# Patient Record
Sex: Female | Born: 2000 | Race: White | Hispanic: No | Marital: Single | Smoking: Never smoker
Health system: Southern US, Community
[De-identification: ages and names within clinical notes are randomized; demographics above are authoritative.]

## PROBLEM LIST (undated history)

## (undated) DIAGNOSIS — R51 Headache: Secondary | ICD-10-CM

## (undated) DIAGNOSIS — R519 Headache, unspecified: Secondary | ICD-10-CM

## (undated) HISTORY — DX: Headache, unspecified: R51.9

## (undated) HISTORY — DX: Headache: R51

## (undated) HISTORY — PX: BIOPSY BOWEL: PRO7

---
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
## 2021-03-08 ENCOUNTER — Ambulatory Visit
Admission: EM | Admit: 2021-03-08 | Discharge: 2021-03-08 | Disposition: A | Discharge status: Home / Self Care | Payer: Federal, State, Local not specified - PPO | Attending: Physician Assistant | Admitting: Physician Assistant

## 2021-03-08 ENCOUNTER — Encounter: Payer: Self-pay | Admitting: Emergency Medicine

## 2021-03-08 ENCOUNTER — Other Ambulatory Visit: Payer: Self-pay

## 2021-03-08 DIAGNOSIS — N912 Amenorrhea, unspecified: Secondary | ICD-10-CM

## 2021-03-08 LAB — PREGNANCY, URINE: Preg Test, Ur: NEGATIVE

## 2021-03-08 MED ORDER — PRENATAL COMPLETE 14-0.4 MG PO TABS: 1.0000 | ORAL_TABLET | Freq: Every day | ORAL | 0 refills | Status: DC

## 2021-03-08 NOTE — ED Provider Notes (Signed)
[facility]    CSN: [id number] Arrival date & time: 03/08/21  1506      History   Chief Complaint Chief Complaint  Patient presents with  . Possible Pregnancy    HPI Jessica Weaver is a 20 y.o. female.   Jessica Weaver presents with complaints of late period as well as breast tenderness in context of positive home digital pregnancy test. States that per the app she uses to track her period she is approximately 7 days late, her LMP was 02/03/21. She is sexually active with her boyfriend, she isn't on birth control and doesn't use condoms. She had cramping last week and thought her period was going to start but it never has. She has had a few episodes of vomiting, without much nausea. No previous pregnancies. Endorses having family support and plans to discuss with her mother. Her sister sees an OB locally and she would feel comfortable following with them as well. She is not taking any regularly prescribed medications outside of amoxicillin she is taking for a sinus infection.    ROS per HPI, negative if not otherwise mentioned.      Past Medical History:  Diagnosis Date  . Headache     Patient Active Problem List   Diagnosis Date Noted  . Episodic tension type headache 06/02/2017  . Migraine 09/18/2015    Past Surgical History:  Procedure Laterality Date  . BIOPSY BOWEL     [facility]  . TONSILLECTOMY AND ADENOIDECTOMY  2015    OB History   No obstetric history on file.      Home Medications    Prior to Admission medications   Medication Sig Start Date End Date Taking? Authorizing Provider  Prenatal Vit-Fe Fumarate-FA (PRENATAL COMPLETE) 14-0.4 MG TABS Take 1 tablet by mouth daily. 03/08/21  Yes Jurni Cesaro, Dorene Grebe B, NP  cefdinir (OMNICEF) 300 MG capsule Take 1 capsule (300 mg total) by mouth 2 (two) times daily. 11/22/20   Fisher, Roselyn Bering, PA-C  Cyanocobalamin (VITAMIN B 12 PO) Take by mouth.    [provider]  Magnesium 200 MG TABS  Take by mouth.    [provider]  nortriptyline (PAMELOR) 10 MG capsule Take 10 mg by mouth at bedtime. 01/19/18 07/18/18  [provider]  promethazine (PHENERGAN) 12.5 MG tablet 1-2 tablets as needed for headache/nausea 12/19/16   Margurite Auerbach, MD  propranolol (INDERAL) 10 MG tablet Take 1/2 tablet at bedtime for 1 week, then 1 tablet at bedtime after that 06/02/17   Elveria Rising, NP  SUMAtriptan (IMITREX) 25 MG tablet Take 1 tablet (25 mg total) by mouth every 2 (two) hours as needed for migraine. May repeat in 2 hours if headache persists or recurs. 12/19/16   Margurite Auerbach, MD  Vitamin D, Ergocalciferol, (DRISDOL) 50000 units CAPS capsule Take 50,000 Units by mouth once a week. 01/25/18   [provider]    Family History Family History  Problem Relation Age of Onset  . Headache Father     Social History Social History   Tobacco Use  . Smoking status: Never Smoker  . Smokeless tobacco: Never Used  Substance Use Topics  . Alcohol use: Never    Alcohol/week: 0.0 standard drinks  . Drug use: Never     Allergies   Patient has no known allergies.   Review of Systems Review of Systems   Physical Exam Triage Vital Signs ED Triage Vitals  Enc Vitals Group     BP  03/08/21 1520 140/90     Pulse Rate 03/08/21 1520 100     Resp 03/08/21 1520 18     Temp 03/08/21 1520 98 F (36.7 C)     Temp Source 03/08/21 1520 Oral     SpO2 03/08/21 1520 100 %     Weight 03/08/21 1518 95 lb (43.1 kg)     Height 03/08/21 1518 4\' 11"  (1.499 m)     Head Circumference --      Peak Flow --      Pain Score 03/08/21 1518 0     Pain Loc --      Pain Edu? --      Excl. in GC? --    No data found.  Updated Vital Signs BP 140/90 (BP Location: Left Arm)   Pulse 100   Temp 98 F (36.7 C) (Oral)   Resp 18   Ht 4\' 11"  (1.499 m)   Wt 95 lb (43.1 kg)   LMP 02/03/2021   SpO2 100%   BMI 19.19 kg/m   Visual Acuity Right Eye Distance:   Left Eye  Distance:   Bilateral Distance:    Right Eye Near:   Left Eye Near:    Bilateral Near:     Physical Exam Constitutional:      General: She is not in acute distress.    Appearance: She is well-developed.  Cardiovascular:     Rate and Rhythm: Normal rate.  Pulmonary:     Effort: Pulmonary effort is normal.  Skin:    General: Skin is warm and dry.  Neurological:     Mental Status: She is alert and oriented to person, place, and time.      UC Treatments / Results  Labs (all labs ordered are listed, but only abnormal results are displayed) Labs Reviewed  PREGNANCY, URINE    EKG   Radiology No results found.  Procedures Procedures (including critical care time)  Medications Ordered in UC Medications - No data to display  Initial Impression / Assessment and Plan / UC Course  I have reviewed the triage vital signs and the nursing notes.  Pertinent labs & imaging results that were available during my care of the patient were reviewed by me and considered in my medical decision making (see chart for details).     Negative urine pregnancy test here in clinic today, however she does produce the positive digital pregnancy test which she took today. I feel the test here is likely a false negative, given this positive she has, late period, and symptoms consistent with early pregnancy. Advised to retest at home in a few more days to a week. If still positive and no bleeding to follow up with OB. Prenatal care discussed. Patient verbalized understanding and agreeable to plan.   Final Clinical Impressions(s) / UC Diagnoses   Final diagnoses:  Amenorrhea     Discharge Instructions     11/10/2021 would be estimated due date based on your last period.  You are approximately 4 w 5d pregnant based on your last period, according to your positive digital home test.  Don't smoke cigarettes.  Don't drink alcohol.  Tylenol as needed. Avoid ibuprofen/ motrin/ advil.  Retake a test  in three days if your period doesn't start. If positive call OB to set up your first appointment. This is typically done at around week 8-10.  Start a prenatal vitamin.  Normal regular activity is perfectly fine while pregnant.  Please seek care if you  have pain and/or bleeding.    ED Prescriptions    Medication Sig Dispense Auth. Provider   Prenatal Vit-Fe Fumarate-FA (PRENATAL COMPLETE) 14-0.4 MG TABS Take 1 tablet by mouth daily. 30 tablet Georgetta Haber, NP     PDMP not reviewed this encounter.   Georgetta Haber, NP 03/08/21 [phone]

## 2021-03-08 NOTE — Discharge Instructions (Addendum)
11/10/2021 would be estimated due date based on your last period.  You are approximately 4 w 5d pregnant based on your last period, according to your positive digital home test.  Don't smoke cigarettes.  Don't drink alcohol.  Tylenol as needed. Avoid ibuprofen/ motrin/ advil.  Retake a test in three days if your period doesn't start. If positive call OB to set up your first appointment. This is typically done at around week 8-10.  Start a prenatal vitamin.  Normal regular activity is perfectly fine while pregnant.  Please seek care if you have pain and/or bleeding.

## 2021-03-08 NOTE — ED Triage Notes (Signed)
Patient reports that she is 1 week late on her cycle. She took a home pregnancy test and this came back positive. She is wanting confirmation.

## 2021-03-12 ENCOUNTER — Ambulatory Visit
Admission: EM | Admit: 2021-03-12 | Discharge: 2021-03-12 | Disposition: A | Discharge status: Home / Self Care | Payer: Federal, State, Local not specified - PPO

## 2021-03-12 ENCOUNTER — Other Ambulatory Visit: Payer: Self-pay

## 2021-03-12 ENCOUNTER — Encounter: Payer: Self-pay | Admitting: Emergency Medicine

## 2021-03-12 DIAGNOSIS — B349 Viral infection, unspecified: Secondary | ICD-10-CM

## 2021-03-12 DIAGNOSIS — R0781 Pleurodynia: Secondary | ICD-10-CM

## 2021-03-12 DIAGNOSIS — R059 Cough, unspecified: Secondary | ICD-10-CM | POA: Diagnosis not present

## 2021-03-12 NOTE — ED Provider Notes (Signed)
[facility]    CSN: [id number] Arrival date & time: 03/12/21  1609      History   Chief Complaint Chief Complaint  Patient presents with  . Cough    HPI Jessica Weaver is a 20 y.o. female who is reportedly [redacted] weeks pregnant.  Patient presents today for approximately 3-week history of cough.  Patient says that over the past week she has had some left-sided lower rib pain and pain that seems to travel through to her back especially when she coughs.  She says her cough is nonproductive.  She denies any shortness of breath.  No fever, fatigue, body aches, nasal congestion, sore throat, abdominal pain, nausea/vomiting or diarrhea.  Not currently taking any OTC meds.  She did go to an urgent care on 03/01/2021 and had a negative PCR COVID test.  She has had a couple negative at home test as well.  She was prescribed amoxicillin at that time and took a 7-day course at that, finishing it about 4 days ago.  Patient has no other concerns.  HPI  Past Medical History:  Diagnosis Date  . Headache     Patient Active Problem List   Diagnosis Date Noted  . Episodic tension type headache 06/02/2017  . Migraine 09/18/2015    Past Surgical History:  Procedure Laterality Date  . BIOPSY BOWEL     [facility]  . TONSILLECTOMY AND ADENOIDECTOMY  2015    OB History    Gravida  1   Para      Term      Preterm      AB      Living        SAB      IAB      Ectopic      Multiple      Live Births               Home Medications    Prior to Admission medications   Medication Sig Start Date End Date Taking? Authorizing Provider  cefdinir (OMNICEF) 300 MG capsule Take 1 capsule (300 mg total) by mouth 2 (two) times daily. 11/22/20   Fisher, Roselyn Bering, PA-C  Cyanocobalamin (VITAMIN B 12 PO) Take by mouth.    [provider]  Magnesium 200 MG TABS Take by mouth.    [provider]  nortriptyline (PAMELOR) 10 MG capsule Take 10 mg by mouth at  bedtime. 01/19/18 07/18/18  [provider]  Prenatal Vit-Fe Fumarate-FA (PRENATAL COMPLETE) 14-0.4 MG TABS Take 1 tablet by mouth daily. 03/08/21   Georgetta Haber, NP  promethazine (PHENERGAN) 12.5 MG tablet 1-2 tablets as needed for headache/nausea 12/19/16   Margurite Auerbach, MD  propranolol (INDERAL) 10 MG tablet Take 1/2 tablet at bedtime for 1 week, then 1 tablet at bedtime after that 06/02/17   Elveria Rising, NP  SUMAtriptan (IMITREX) 25 MG tablet Take 1 tablet (25 mg total) by mouth every 2 (two) hours as needed for migraine. May repeat in 2 hours if headache persists or recurs. 12/19/16   Margurite Auerbach, MD  Vitamin D, Ergocalciferol, (DRISDOL) 50000 units CAPS capsule Take 50,000 Units by mouth once a week. 01/25/18   [provider]    Family History Family History  Problem Relation Age of Onset  . Headache Father     Social History Social History   Tobacco Use  . Smoking status: Never Smoker  . Smokeless tobacco: Never Used  Substance Use Topics  .  Alcohol use: Never    Alcohol/week: 0.0 standard drinks  . Drug use: Never     Allergies   Patient has no known allergies.   Review of Systems Review of Systems  Constitutional: Negative for chills, diaphoresis, fatigue and fever.  HENT: Negative for congestion, ear pain, rhinorrhea, sinus pressure, sinus pain and sore throat.   Respiratory: Positive for cough. Negative for shortness of breath.   Cardiovascular: Positive for chest pain (left rib pain).  Gastrointestinal: Negative for abdominal pain, nausea and vomiting.  Musculoskeletal: Negative for arthralgias and myalgias.  Skin: Negative for rash.  Neurological: Negative for weakness and headaches.  Hematological: Negative for adenopathy.     Physical Exam Triage Vital Signs ED Triage Vitals  Enc Vitals Group     BP 03/12/21 1632 133/81     Pulse Rate 03/12/21 1632 (!) 102     Resp 03/12/21 1632 17     Temp 03/12/21 1632 98.7 F (37.1  C)     Temp Source 03/12/21 1632 Oral     SpO2 03/12/21 1632 100 %     Weight 03/12/21 1630 97 lb (44 kg)     Height 03/12/21 1630 4\' 11"  (1.499 m)     Head Circumference --      Peak Flow --      Pain Score 03/12/21 1630 3     Pain Loc --      Pain Edu? --      Excl. in GC? --    No data found.  Updated Vital Signs BP 133/81 (BP Location: Left Arm)   Pulse (!) 102   Temp 98.7 F (37.1 C) (Oral)   Resp 17   Ht 4\' 11"  (1.499 m)   Wt 97 lb (44 kg)   LMP 02/03/2021   SpO2 100%   BMI 19.59 kg/m      Physical Exam Vitals and nursing note reviewed.  Constitutional:      General: She is not in acute distress.    Appearance: Normal appearance. She is not ill-appearing or toxic-appearing.  HENT:     Head: Normocephalic and atraumatic.     Nose: Nose normal.     Mouth/Throat:     Mouth: Mucous membranes are moist.     Pharynx: Oropharynx is clear.  Eyes:     General: No scleral icterus.       Right eye: No discharge.        Left eye: No discharge.     Conjunctiva/sclera: Conjunctivae normal.  Cardiovascular:     Rate and Rhythm: Normal rate and regular rhythm.     Heart sounds: Normal heart sounds.  Pulmonary:     Effort: Pulmonary effort is normal. No respiratory distress.     Breath sounds: Normal breath sounds. No wheezing, rhonchi or rales.  Musculoskeletal:     Cervical back: Neck supple.  Skin:    General: Skin is dry.  Neurological:     General: No focal deficit present.     Mental Status: She is alert. Mental status is at baseline.     Motor: No weakness.     Gait: Gait normal.  Psychiatric:        Mood and Affect: Mood normal.        Behavior: Behavior normal.        Thought Content: Thought content normal.      UC Treatments / Results  Labs (all labs ordered are listed, but only abnormal results are displayed) Labs Reviewed -  No data to display  EKG   Radiology No results found.  Procedures Procedures (including critical care  time)  Medications Ordered in UC Medications - No data to display  Initial Impression / Assessment and Plan / UC Course  I have reviewed the triage vital signs and the nursing notes.  Pertinent labs & imaging results that were available during my care of the patient were reviewed by me and considered in my medical decision making (see chart for details).   19 year old female who is reportedly [redacted] weeks pregnant presenting for cough x3 weeks.  All vital signs are normal and stable.  Her oxygen is 100%.  She is overall well-appearing.  We did discuss obtaining a chest x-ray and reviewed the risks versus benefits of that.  Advised her that based on her vitals and exam which is normal, I would hold off on an x-ray.  Advised her that we can shoulder from radiation but it can still travel.  Patient aware of the risk and would like to hold off on obtaining a chest x-ray at this time.  Advised her that the rib pain is likely secondary to coughing and I suggest suppressing the cough with Robitussin.  Advised her to stay hydrated.  We talked about taking Tylenol for the pain and also trying lidocaine or Salonpas patches.  Advised her to return for any fever, worsening cough, increased chest pain or shortness of breath.  Patient agreeable to plan.   Final Clinical Impressions(s) / UC Diagnoses   Final diagnoses:  Viral illness  Cough  Rib pain     Discharge Instructions     All of your vital signs are normal today.  Your exam is also normal.  Your lungs are clear when I listen to you.  I suspect that you have a viral illness versus allergies.  We did discuss obtaining a chest x-ray for your persistent symptoms and rib pain but after realizing the possible complications that could result from any radiation to the fetus, you have decided to go home and continue with supportive care at this time and follow-up for any worsening symptoms.  I have advised you to take Robitussin as needed for the cough and  increase your rest and fluids.  You can take Tylenol for pain relief.  You can take 1 g up to 3 times a day.  You can also apply Salonpas patch or lidocaine patch to the area that hurts.  You can also try heat or ice.  If you need help falling asleep you can take Benadryl or melatonin which are both safe.  Consider taking daily Claritin and using Flonase for possible allergies.  If you develop a fever, productive cough, increased rib pain or shortness of breath he should be seen again.  For any severe acute worsening of symptoms, please go to the emergency room.    ED Prescriptions    None     PDMP not reviewed this encounter.   Shirlee Latch, PA-C 03/12/21 1706

## 2021-03-12 NOTE — ED Triage Notes (Signed)
Patient c/o cough for over a week.  Patient states that she had a COVID test at a walk-in clinic last week and was negative.  Patient denies SOB.  Patient reports some discomfort in her chest when she take a deep breath.

## 2021-03-12 NOTE — Discharge Instructions (Addendum)
All of your vital signs are normal today.  Your exam is also normal.  Your lungs are clear when I listen to you.  I suspect that you have a viral illness versus allergies.  We did discuss obtaining a chest x-ray for your persistent symptoms and rib pain but after realizing the possible complications that could result from any radiation to the fetus, you have decided to go home and continue with supportive care at this time and follow-up for any worsening symptoms.  I have advised you to take Robitussin as needed for the cough and increase your rest and fluids.  You can take Tylenol for pain relief.  You can take 1 g up to 3 times a day.  You can also apply Salonpas patch or lidocaine patch to the area that hurts.  You can also try heat or ice.  If you need help falling asleep you can take Benadryl or melatonin which are both safe.  Consider taking daily Claritin and using Flonase for possible allergies.  If you develop a fever, productive cough, increased rib pain or shortness of breath he should be seen again.  For any severe acute worsening of symptoms, please go to the emergency room.

## 2021-06-30 ENCOUNTER — Other Ambulatory Visit: Payer: Self-pay

## 2021-06-30 ENCOUNTER — Ambulatory Visit
Admission: EM | Admit: 2021-06-30 | Discharge: 2021-06-30 | Disposition: A | Discharge status: Home / Self Care | Payer: Federal, State, Local not specified - PPO | Attending: Emergency Medicine | Admitting: Emergency Medicine

## 2021-06-30 DIAGNOSIS — M542 Cervicalgia: Secondary | ICD-10-CM

## 2021-06-30 DIAGNOSIS — S29019A Strain of muscle and tendon of unspecified wall of thorax, initial encounter: Secondary | ICD-10-CM

## 2021-06-30 MED ORDER — IBUPROFEN 600 MG PO TABS
600.0000 mg | ORAL_TABLET | Freq: Four times a day (QID) | ORAL | 0 refills | Status: DC | PRN
Start: 2021-06-30 — End: 2022-07-17

## 2021-06-30 MED ORDER — BACLOFEN 10 MG PO TABS: 10.0000 mg | ORAL_TABLET | Freq: Three times a day (TID) | ORAL | 0 refills | Status: DC

## 2021-06-30 NOTE — Discharge Instructions (Addendum)
Take the ibuprofen, 600 mg every 6 hours with food, on a schedule for the next 48 hours and then as needed.  Take the Baclofen, 10 mg every 8 hours, on a schedule for the next 48 hours and then as needed.  Apply moist heat to your back for 30 minutes at a time 2-3 times a day to improve blood flow to the area and help remove the lactic acid causing the spasm.  Follow the neck/back exercises given at discharge.  Follow-up with your PCP for lab work to check B vitamin levels, viral infection, and hormones.   Return for reevaluation for any new or worsening symptoms.

## 2021-06-30 NOTE — ED Provider Notes (Signed)
[facility]    CSN: [id number] Arrival date & time: 06/30/21  0813      History   Chief Complaint Chief Complaint  Patient presents with   Back Pain    HPI Jessica Weaver is a 20 y.o. female.   HPI  20 year old female here for evaluation of upper back pain and facial numbness.  Patient reports that she has been experiencing pain in her upper back and neck for the past 2 days.  Her facial numbness has been occurring at random times for the past 2 weeks.  She does not remember any increase stress, feelings of anxiety, or heavy breathing at the time.  She reports that her entire face and her ears will go numb and then will resolve.  She does have a distant history of migraines and also B12 deficiency but she has not had a migraine in a while and she has not been on her B12 for a while either.  She recently turned 19 and she is transitioning between the pediatric and adult side of her primary care but has not been established with an adult primary care yet.  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  She denies any accidents or falls.  She has not had any headaches, change in her vision, or difficulty performing her ADLs.  She also has not added any new medications or supplements.  She did recently stop her birth control patch.  Past Medical History:  Diagnosis Date   Headache     Patient Active Problem List   Diagnosis Date Noted   Episodic tension type headache 06/02/2017   Migraine 09/18/2015    Past Surgical History:  Procedure Laterality Date   BIOPSY BOWEL     [facility]   TONSILLECTOMY AND ADENOIDECTOMY  2015    OB History     Gravida  1   Para      Term      Preterm      AB      Living         SAB      IAB      Ectopic      Multiple      Live Births               Home Medications    Prior to Admission medications   Medication Sig Start Date End  Date Taking? Authorizing Provider  baclofen (LIORESAL) 10 MG tablet Take 1 tablet (10 mg total) by mouth 3 (three) times daily. 06/30/21  Yes Becky Augusta, NP  ibuprofen (ADVIL) 600 MG tablet Take 1 tablet (600 mg total) by mouth every 6 (six) hours as needed. 06/30/21  Yes Becky Augusta, NP  nortriptyline (PAMELOR) 10 MG capsule Take 10 mg by mouth at bedtime. 01/19/18 07/18/18  [provider]    Family History Family History  Problem Relation Age of Onset   Headache Father     Social History Social History   Tobacco Use   Smoking status: Never   Smokeless tobacco: Never  Substance Use Topics   Alcohol use: Never    Alcohol/week: 0.0 standard drinks   Drug use: Never     Allergies   Patient has no known allergies.   Review of Systems Review of Systems  Constitutional:  Negative for activity change, appetite change and fever.  Eyes:  Negative for visual disturbance.  Musculoskeletal:  Positive for back pain and neck pain.  Neurological:  Positive for numbness. Negative for weakness and headaches.  Hematological: Negative.   Psychiatric/Behavioral: Negative.      Physical Exam Triage Vital Signs ED Triage Vitals  Enc Vitals Group     BP 06/30/21 0831 116/74     Pulse Rate 06/30/21 0831 77     Resp 06/30/21 0831 18     Temp 06/30/21 0831 98 F (36.7 C)     Temp Source 06/30/21 0831 Oral     SpO2 06/30/21 0831 100 %     Weight 06/30/21 0830 94 lb (42.6 kg)     Height 06/30/21 0830 4\' 11"  (1.499 m)     Head Circumference --      Peak Flow --      Pain Score 06/30/21 0829 8     Pain Loc --      Pain Edu? --      Excl. in GC? --    No data found.  Updated Vital Signs BP 116/74 (BP Location: Left Arm)   Pulse 77   Temp 98 F (36.7 C) (Oral)   Resp 18   Ht 4\' 11"  (1.499 m)   Wt 94 lb (42.6 kg)   LMP 06/16/2021   SpO2 100%   Breastfeeding No   BMI 18.99 kg/m   Visual Acuity Right Eye Distance:   Left Eye Distance:   Bilateral Distance:     Right Eye Near:   Left Eye Near:    Bilateral Near:     Physical Exam Vitals and nursing note reviewed.  Constitutional:      General: She is not in acute distress.    Appearance: Normal appearance. She is normal weight. She is not ill-appearing.  HENT:     Head: Normocephalic and atraumatic.     Right Ear: Tympanic membrane, ear canal and external ear normal. There is no impacted cerumen.     Left Ear: Tympanic membrane, ear canal and external ear normal. There is no impacted cerumen.     Mouth/Throat:     Mouth: Mucous membranes are moist.     Pharynx: Oropharynx is clear. No posterior oropharyngeal erythema.  Eyes:     General: No scleral icterus.    Extraocular Movements: Extraocular movements intact.     Conjunctiva/sclera: Conjunctivae normal.     Pupils: Pupils are equal, round, and reactive to light.  Cardiovascular:     Rate and Rhythm: Normal rate and regular rhythm.     Pulses: Normal pulses.     Heart sounds: Normal heart sounds. No murmur heard.   No gallop.  Pulmonary:     Effort: Pulmonary effort is normal.     Breath sounds: Normal breath sounds. No wheezing, rhonchi or rales.  Musculoskeletal:        General: No swelling or tenderness. Normal range of motion.     Cervical back: Normal range of motion and neck supple. No rigidity.  Lymphadenopathy:     Cervical: No cervical adenopathy.  Skin:    General: Skin is warm and dry.     Capillary Refill: Capillary refill takes less than 2 seconds.     Findings: No erythema or rash.  Neurological:     General: No focal deficit present.     Mental Status: She is alert and oriented to person, place, and time.     Cranial Nerves: No cranial nerve deficit.     Sensory: No sensory deficit.  Motor: No weakness.     Coordination: Coordination normal.     Deep Tendon Reflexes: Reflexes normal.  Psychiatric:        Mood and Affect: Mood normal.        Behavior: Behavior normal.        Thought Content: Thought  content normal.        Judgment: Judgment normal.     UC Treatments / Results  Labs (all labs ordered are listed, but only abnormal results are displayed) Labs Reviewed - No data to display  EKG   Radiology No results found.  Procedures Procedures (including critical care time)  Medications Ordered in UC Medications - No data to display  Initial Impression / Assessment and Plan / UC Course  I have reviewed the triage vital signs and the nursing notes.  Pertinent labs & imaging results that were available during my care of the patient were reviewed by me and considered in my medical decision making (see chart for details).  Patient is a very pleasant, nontoxic-appearing 20 year old female here for evaluation of upper back and neck pain x2 days and intermittent facial numbness for 2 weeks.  She is not experiencing facial numbness at this time.  She also indicates that sometimes she will feel numbness through her whole body but none of that is present currently.  Physical exam reveals pearly gray tympanic membranes bilaterally with normal light reflex and clear external auditory canals.  Pupils are equal round and reactive, EOM is intact, and cranial nerves II through XII are intact.  Oropharyngeal exam is benign.  No cervical lymphadenopathy appreciated exam.  Cardiopulmonary exam reveals clear lung sounds in all fields.  Patient does have bilateral lower cervical paraspinous tenderness and muscle tension as well as upper thoracic muscle tension in bilateral paraspinous regions.  No midline spinal tenderness appreciated exam.  Her tenderness is greater on the right than the left.  Bilateral grips and upper extremity strength are 5/5 and lower extremity strength is 5/5.  DTRs are 2+ globally.  Patient's neurological exam is benign and the etiology of her intermittent facial numbness and body numbness is unclear.  This could be be vitamin deficiency, hormonal related, or idiopathic.  The neck  and upper back pain are secondary to muscle tension and mild spasm.  We will treat with 600 mg ibuprofen tablets and 10 mg baclofen tablets to help with inflammation and spasm.  Patient also given back and neck exercises to do at home.  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  Patient encouraged to follow-up with her primary care doctor for further testing to determine the cause of her intermittent numbness.   Final Clinical Impressions(s) / UC Diagnoses   Final diagnoses:  Cervicalgia  Thoracic myofascial strain, initial encounter     Discharge Instructions      Take the ibuprofen, 600 mg every 6 hours with food, on a schedule for the next 48 hours and then as needed.  Take the Baclofen, 10 mg every 8 hours, on a schedule for the next 48 hours and then as needed.  Apply moist heat to your back for 30 minutes at a time 2-3 times a day to improve blood flow to the area and help remove  the lactic acid causing the spasm.  Follow the neck/back exercises given at discharge.  Follow-up with your PCP for lab work to check B vitamin levels, viral infection, and hormones.   Return for reevaluation for any new or worsening symptoms.      ED Prescriptions     Medication Sig Dispense Auth. Provider   baclofen (LIORESAL) 10 MG tablet Take 1 tablet (10 mg total) by mouth 3 (three) times daily. 30 each Becky Augusta, NP   ibuprofen (ADVIL) 600 MG tablet Take 1 tablet (600 mg total) by mouth every 6 (six) hours as needed. 30 tablet Becky Augusta, NP      PDMP not reviewed this encounter.   Becky Augusta, NP 06/30/21 [phone]

## 2021-06-30 NOTE — ED Triage Notes (Signed)
Patient states that she has been having upper back pain "around my spine". States that she has noticed at times her face and arms will go numb. States that this started 2 weeks ago and worsened on Monday. Denies any known injury.

## 2021-08-09 ENCOUNTER — Ambulatory Visit
Admission: EM | Admit: 2021-08-09 | Discharge: 2021-08-09 | Disposition: A | Discharge status: Home / Self Care | Payer: Federal, State, Local not specified - PPO

## 2021-08-09 ENCOUNTER — Encounter: Payer: Self-pay | Admitting: Emergency Medicine

## 2021-08-09 ENCOUNTER — Other Ambulatory Visit: Payer: Self-pay

## 2021-08-09 DIAGNOSIS — R051 Acute cough: Secondary | ICD-10-CM

## 2021-08-09 DIAGNOSIS — R0981 Nasal congestion: Secondary | ICD-10-CM | POA: Diagnosis not present

## 2021-08-09 DIAGNOSIS — J069 Acute upper respiratory infection, unspecified: Secondary | ICD-10-CM

## 2021-08-09 NOTE — ED Provider Notes (Signed)
[facility]    CSN: [id number] Arrival date & time: 08/09/21  1020      History   Chief Complaint Chief Complaint  Patient presents with   Cough   Nasal Congestion    HPI Jessica Weaver is a 20 y.o. female presenting for approximately 1 week history of nasal congestion, sinus pressure and cough.  Nasal congestion is significant and rhinorrhea is yellowish.  Patient also mitts to headache and pain in her wisdom teeth.  No chest pain or breathing difficulty.  No vomiting or diarrhea.  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  States multiple children have been out with RSV.  She has had a negative at home COVID test and does not desire to be retested.  Patient has been taking DayQuil and NyQuil without improvement in symptoms.  She has not had any fevers and no worsening of symptoms but has not felt better.  No other complaints or concerns.  HPI  Past Medical History:  Diagnosis Date   Headache     Patient Active Problem List   Diagnosis Date Noted   Episodic tension type headache 06/02/2017   Migraine 09/18/2015    Past Surgical History:  Procedure Laterality Date   BIOPSY BOWEL     [facility]   TONSILLECTOMY AND ADENOIDECTOMY  2015    OB History     Gravida  1   Para      Term      Preterm      AB      Living         SAB      IAB      Ectopic      Multiple      Live Births               Home Medications    Prior to Admission medications   Medication Sig Start Date End Date Taking? Authorizing Provider  baclofen (LIORESAL) 10 MG tablet Take 1 tablet (10 mg total) by mouth 3 (three) times daily. 06/30/21   Becky Augusta, NP  ibuprofen (ADVIL) 600 MG tablet Take 1 tablet (600 mg total) by mouth every 6 (six) hours as needed. 06/30/21   Becky Augusta, NP  nortriptyline (PAMELOR) 10 MG capsule Take 10 mg by mouth at bedtime. 01/19/18 07/18/18  [provider]    Family History Family History  Problem Relation Age of Onset    Headache Father     Social History Social History   Tobacco Use   Smoking status: Never   Smokeless tobacco: Never  Vaping Use   Vaping Use: Never used  Substance Use Topics   Alcohol use: Never    Alcohol/week: 0.0 standard drinks   Drug use: Never     Allergies   Patient has no known allergies.   Review of Systems Review of Systems  Constitutional:  Positive for fatigue. Negative for chills, diaphoresis and fever.  HENT:  Positive for congestion, rhinorrhea and sinus pressure. Negative for ear pain and sore throat.   Respiratory:  Positive for cough. Negative for shortness of breath.   Gastrointestinal:  Negative for abdominal pain, nausea and vomiting.  Musculoskeletal:  Negative for arthralgias and myalgias.  Skin:  Negative for rash.  Neurological:  Positive for headaches. Negative for weakness.  Hematological:  Negative for adenopathy.    Physical Exam Triage Vital Signs ED Triage Vitals  Enc Vitals Group     BP 08/09/21 1104 118/80  Pulse Rate 08/09/21 1104 (!) 110     Resp 08/09/21 1104 18     Temp 08/09/21 1104 98.3 F (36.8 C)     Temp Source 08/09/21 1104 Oral     SpO2 08/09/21 1104 99 %     Weight 08/09/21 1102 96 lb 4.8 oz (43.7 kg)     Height --      Head Circumference --      Peak Flow --      Pain Score 08/09/21 1101 2     Pain Loc --      Pain Edu? --      Excl. in GC? --    No data found.  Updated Vital Signs BP 118/80 (BP Location: Left Arm)   Pulse (!) 110   Temp 98.3 F (36.8 C) (Oral)   Resp 18   Wt 96 lb 4.8 oz (43.7 kg)   LMP 07/17/2021   SpO2 99%   BMI 19.45 kg/m      Physical Exam Vitals and nursing note reviewed.  Constitutional:      General: She is not in acute distress.    Appearance: Normal appearance. She is ill-appearing. She is not toxic-appearing.  HENT:     Head: Normocephalic and atraumatic.     Right Ear: Tympanic membrane, ear canal and external ear normal.     Left Ear: Tympanic membrane, ear  canal and external ear normal.     Nose: Congestion present.     Mouth/Throat:     Mouth: Mucous membranes are moist.     Pharynx: Oropharynx is clear.  Eyes:     General: No scleral icterus.       Right eye: No discharge.        Left eye: No discharge.     Conjunctiva/sclera: Conjunctivae normal.  Cardiovascular:     Rate and Rhythm: Regular rhythm. Tachycardia present.     Heart sounds: Normal heart sounds.  Pulmonary:     Effort: Pulmonary effort is normal. No respiratory distress.     Breath sounds: Normal breath sounds.  Musculoskeletal:     Cervical back: Neck supple.  Skin:    General: Skin is dry.  Neurological:     General: No focal deficit present.     Mental Status: She is alert. Mental status is at baseline.     Motor: No weakness.     Gait: Gait normal.  Psychiatric:        Mood and Affect: Mood normal.        Behavior: Behavior normal.        Thought Content: Thought content normal.     UC Treatments / Results  Labs (all labs ordered are listed, but only abnormal results are displayed) Labs Reviewed - No data to display  EKG   Radiology No results found.  Procedures Procedures (including critical care time)  Medications Ordered in UC Medications - No data to display  Initial Impression / Assessment and Plan / UC Course  I have reviewed the triage vital signs and the nursing notes.  Pertinent labs & imaging results that were available during my care of the patient were reviewed by me and considered in my medical decision making (see chart for details).  20 year old female presenting for 1 week history of nasal congestion, sinus pressure and cough.  Vitals are normal and stable.  She is ill-appearing but nontoxic.  Exam significant for moderate nasal congestion.  Chest is clear to auscultation heart regular rate  and rhythm.  Clinical presentation consistent with viral URI.  Advised switching to Sudafed at this time and starting Flonase and nasal  saline.  Advised increasing rest and fluids.  Reassured her that she should be feeling better in the next few days.  Thoroughly reviewed return and ED precautions with patient.  Work note has been given for the next couple days.  Final Clinical Impressions(s) / UC Diagnoses   Final diagnoses:  Viral upper respiratory tract infection  Nasal congestion  Acute cough     Discharge Instructions      -Your symptoms are consistent with a viral upper respiratory infection.  If you do not think DayQuil and NyQuil are working switch to [employer].  I will also suggest that you use Flonase since her nose is swollen.  Increase rest and fluids and continue with the vaporizer. -You may take another few days for you to feel better but you should not really be feeling any worse.  If you have a fever or start to have any breathing issues you should be seen again.     ED Prescriptions   None    PDMP not reviewed this encounter.   Shirlee Latch, PA-C 08/09/21 1153

## 2021-08-09 NOTE — ED Triage Notes (Signed)
Pt c/o nasal congestion, cough. Started about a week ago. She states she started getting worse about 2 days ago. Denies fever but has felt like she had one. She states her head and back upper teeth have been hurting also. 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

## 2021-08-09 NOTE — Discharge Instructions (Addendum)
-  Your symptoms are consistent with a viral upper respiratory infection.  If you do not think DayQuil and NyQuil are working switch to [employer].  I will also suggest that you use Flonase since her nose is swollen.  Increase rest and fluids and continue with the vaporizer. -You may take another few days for you to feel better but you should not really be feeling any worse.  If you have a fever or start to have any breathing issues you should be seen again.

## 2021-08-14 ENCOUNTER — Other Ambulatory Visit: Payer: Self-pay

## 2021-08-14 ENCOUNTER — Emergency Department: Payer: Federal, State, Local not specified - PPO

## 2021-08-14 ENCOUNTER — Encounter: Payer: Self-pay | Admitting: Intensive Care

## 2021-08-14 ENCOUNTER — Emergency Department
Admission: EM | Admit: 2021-08-14 | Discharge: 2021-08-15 | Disposition: A | Discharge status: Home / Self Care | Payer: Federal, State, Local not specified - PPO | Attending: Emergency Medicine | Admitting: Emergency Medicine

## 2021-08-14 DIAGNOSIS — R1110 Vomiting, unspecified: Secondary | ICD-10-CM | POA: Insufficient documentation

## 2021-08-14 DIAGNOSIS — B349 Viral infection, unspecified: Secondary | ICD-10-CM | POA: Diagnosis not present

## 2021-08-14 DIAGNOSIS — R519 Headache, unspecified: Secondary | ICD-10-CM | POA: Diagnosis present

## 2021-08-14 DIAGNOSIS — M542 Cervicalgia: Secondary | ICD-10-CM | POA: Diagnosis not present

## 2021-08-14 LAB — COMPREHENSIVE METABOLIC PANEL
ALT: 10 U/L (ref 0–44)
AST: 15 U/L (ref 15–41)
Albumin: 4.4 g/dL (ref 3.5–5.0)
Alkaline Phosphatase: 112 U/L (ref 38–126)
Anion gap: 11 (ref 5–15)
BUN: 11 mg/dL (ref 6–20)
CO2: 25 mmol/L (ref 22–32)
Calcium: 9.2 mg/dL (ref 8.9–10.3)
Chloride: 99 mmol/L (ref 98–111)
Creatinine, Ser: 0.6 mg/dL (ref 0.44–1.00)
GFR, Estimated: >60 mL/min (ref >60)
Glucose, Bld: 99 mg/dL (ref 70–99)
Potassium: 3.6 mmol/L (ref 3.5–5.1)
Sodium: 135 mmol/L (ref 135–145)
Total Bilirubin: 0.9 mg/dL (ref 0.3–1.2)
Total Protein: 7.6 g/dL (ref 6.5–8.1)

## 2021-08-14 LAB — CBC WITH DIFFERENTIAL/PLATELET
Abs Immature Granulocytes: 0.05 10*3/uL (ref 0.00–0.07)
Basophils Absolute: 0 10*3/uL (ref 0.0–0.1)
Basophils Relative: 0 %
Eosinophils Absolute: 0.1 10*3/uL (ref 0.0–0.5)
Eosinophils Relative: 0 %
HCT: 38.8 % (ref 36.0–46.0)
Hemoglobin: 13.4 g/dL (ref 12.0–15.0)
Immature Granulocytes: 0 %
Lymphocytes Relative: 13 %
Lymphs Abs: 1.6 10*3/uL (ref 0.7–4.0)
MCH: 30.4 pg (ref 26.0–34.0)
MCHC: 34.5 g/dL (ref 30.0–36.0)
MCV: 88 fL (ref 80.0–100.0)
Monocytes Absolute: 0.7 10*3/uL (ref 0.1–1.0)
Monocytes Relative: 6 %
Neutro Abs: 10 10*3/uL — ABNORMAL HIGH (ref 1.7–7.7)
Neutrophils Relative %: 81 %
Platelets: 345 10*3/uL (ref 150–400)
RBC: 4.41 MIL/uL (ref 3.87–5.11)
RDW: 11.9 % (ref 11.5–15.5)
WBC: 12.4 10*3/uL — ABNORMAL HIGH (ref 4.0–10.5)
nRBC: 0 % (ref 0.0–0.2)

## 2021-08-14 MED ORDER — ONDANSETRON HCL 4 MG/2ML IJ SOLN
4.0000 mg | Freq: Once | INTRAMUSCULAR | Status: AC
  Administered 2021-08-15: 4 mg via INTRAVENOUS
  Filled 2021-08-14: qty 2

## 2021-08-14 MED ORDER — SODIUM CHLORIDE 0.9 % IV BOLUS (SEPSIS)
1000.0000 mL | Freq: Once | INTRAVENOUS | Status: AC
  Administered 2021-08-15: 1000 mL via INTRAVENOUS

## 2021-08-14 MED ORDER — KETOROLAC TROMETHAMINE 30 MG/ML IJ SOLN
30.0000 mg | Freq: Once | INTRAMUSCULAR | Status: AC
  Administered 2021-08-15: 30 mg via INTRAVENOUS
  Filled 2021-08-14: qty 1

## 2021-08-14 NOTE — ED Notes (Signed)
Patient up to provide a urine sample at this time

## 2021-08-14 NOTE — ED Notes (Signed)
Patient taken to xray at this time.
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
## 2021-08-15 LAB — URINALYSIS, COMPLETE (UACMP) WITH MICROSCOPIC
Bilirubin Urine: NEGATIVE
Glucose, UA: NEGATIVE mg/dL
Ketones, ur: 20 mg/dL — AB
Leukocytes,Ua: NEGATIVE
Nitrite: NEGATIVE
Protein, ur: NEGATIVE mg/dL
Specific Gravity, Urine: 1.027 (ref 1.005–1.030)
pH: 5 (ref 5.0–8.0)

## 2021-08-15 LAB — LIPASE, BLOOD: Lipase: 26 U/L (ref 11–51)

## 2021-08-15 MED ORDER — ONDANSETRON 4 MG PO TBDP: 4.0000 mg | ORAL_TABLET | Freq: Four times a day (QID) | ORAL | 0 refills | Status: DC | PRN

## 2021-08-15 NOTE — Discharge Instructions (Signed)
You may alternate Tylenol 650 mg every 6 hours as needed for pain, fever and Ibuprofen 600 mg every 8 hours as needed for pain, fever.  Please take Ibuprofen with food.  Do not take more than 4000 mg of Tylenol (acetaminophen) in a 24 hour period.  

## 2022-07-17 ENCOUNTER — Ambulatory Visit
Admission: EM | Admit: 2022-07-17 | Discharge: 2022-07-17 | Disposition: A | Discharge status: Home / Self Care | Payer: Federal, State, Local not specified - PPO | Attending: Family Medicine | Admitting: Family Medicine

## 2022-07-17 ENCOUNTER — Encounter: Payer: Self-pay | Admitting: Emergency Medicine

## 2022-07-17 DIAGNOSIS — J029 Acute pharyngitis, unspecified: Secondary | ICD-10-CM | POA: Insufficient documentation

## 2022-07-17 LAB — GROUP A STREP BY PCR: Group A Strep by PCR: NOT DETECTED

## 2022-07-17 MED ORDER — NAPROXEN 500 MG PO TABS: 500.0000 mg | ORAL_TABLET | Freq: Two times a day (BID) | ORAL | 0 refills | Status: AC | PRN

## 2022-07-17 NOTE — Discharge Instructions (Signed)
Lots of fluids.  Be sure to test for COVID at home as we discussed.  Medication as directed for sore throat.  Take care  Dr. Lacinda Axon

## 2022-07-17 NOTE — ED Triage Notes (Signed)
Patient c/o sore throat that started yesterday.  Patient states that she had cold symptoms that started a week ago.  Patient states that she was tested last week for covid and flu was negative.

## 2022-07-17 NOTE — ED Provider Notes (Signed)
[facility]    CSN: [id number] Arrival date & time: 07/17/22  0913      History   Chief Complaint Chief Complaint  Patient presents with   Sore Throat    HPI 21 year old female presents with sore throat.  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  She reports that over the past week she had cold symptoms.  She tested negative for COVID at home.  She states that sore throat started yesterday.  She reports that her sore throat is quite severe.  8/10 in severity.  She states that this is the only thing that is bothering her currently.  No fever.  No relieving factors.  No other complaints or concerns at this time.   Patient Active Problem List   Diagnosis Date Noted   Episodic tension type headache 06/02/2017   Migraine 09/18/2015    Past Surgical History:  Procedure Laterality Date   BIOPSY BOWEL     [facility] [facility]   TONSILLECTOMY AND ADENOIDECTOMY  2015    OB History     Gravida  1   Para      Term      Preterm      AB      Living         SAB      IAB      Ectopic      Multiple      Live Births               Home Medications    Prior to Admission medications   Medication Sig Start Date End Date Taking? Authorizing Provider  Biotin 1000 MCG tablet Take by mouth.   Yes [provider]  Multiple Vitamins-Minerals (MULTIVITAMIN GUMMIES WOMENS) CHEW Chew by mouth.   Yes [provider]  naproxen (NAPROSYN) 500 MG tablet Take 1 tablet (500 mg total) by mouth 2 (two) times daily as needed for moderate pain. 07/17/22  Yes Coral Spikes, DO    Family History Family History  Problem Relation Age of Onset   Headache Father     Social History Social History   Tobacco Use   Smoking status: Never   Smokeless tobacco: Never  Vaping Use   Vaping Use: Never used  Substance Use Topics   Alcohol use: Never    Alcohol/week: 0.0 standard drinks of alcohol   Drug use: Never     Allergies   Patient has no known  allergies.   Review of Systems Review of Systems Per HPI  Physical Exam Triage Vital Signs ED Triage Vitals  Enc Vitals Group     BP 07/17/22 0927 131/84     Pulse Rate 07/17/22 0927 (!) 106     Resp 07/17/22 0927 14     Temp 07/17/22 0927 98.3 F (36.8 C)     Temp Source 07/17/22 0927 Oral     SpO2 07/17/22 0927 100 %     Weight 07/17/22 0923 100 lb (45.4 kg)     Height 07/17/22 0923 4\' 11"  (1.499 m)     Head Circumference --      Peak Flow --      Pain Score 07/17/22 0922 8     Pain Loc --      Pain Edu? --      Excl. in [place]? --    Updated Vital Signs BP 131/84 (BP Location: Right Arm)   Pulse (!) 106   Temp 98.3 F (36.8 C) (Oral)  Resp 14   Ht 4\' 11"  (1.499 m)   Wt 45.4 kg   LMP 06/25/2022 (Approximate)   SpO2 100%   BMI 20.20 kg/m   Visual Acuity Right Eye Distance:   Left Eye Distance:   Bilateral Distance:    Right Eye Near:   Left Eye Near:    Bilateral Near:     Physical Exam Vitals and nursing note reviewed.  Constitutional:      General: She is not in acute distress.    Appearance: Normal appearance.  HENT:     Head: Normocephalic and atraumatic.     Right Ear: Tympanic membrane normal.     Left Ear: Tympanic membrane normal.     Mouth/Throat:     Pharynx: Posterior oropharyngeal erythema present.  Cardiovascular:     Rate and Rhythm: Normal rate and regular rhythm.  Pulmonary:     Effort: Pulmonary effort is normal.     Breath sounds: Normal breath sounds. No wheezing, rhonchi or rales.  Neurological:     Mental Status: She is alert.  Psychiatric:        Mood and Affect: Mood normal.        Behavior: Behavior normal.      UC Treatments / Results  Labs (all labs ordered are listed, but only abnormal results are displayed) Labs Reviewed  GROUP A STREP BY PCR    EKG   Radiology No results found.  Procedures Procedures (including critical care time)  Medications Ordered in UC Medications - No data to display  Initial  Impression / Assessment and Plan / UC Course  I have reviewed the triage vital signs and the nursing notes.  Pertinent labs & imaging results that were available during my care of the patient were reviewed by me and considered in my medical decision making (see chart for details).    21 year old female presents with sore throat.  Strep negative.  Offered COVID testing here.  Patient and mother state that she will test at home.  Naproxen as directed for sore throat.  Supportive care.  Final Clinical Impressions(s) / UC Diagnoses   Final diagnoses:  Viral pharyngitis     Discharge Instructions      Lots of fluids.  Be sure to test for COVID at home as we discussed.  Medication as directed for sore throat.  Take care  Dr. Lacinda Axon      ED Prescriptions     Medication Sig Dispense Auth. Provider   naproxen (NAPROSYN) 500 MG tablet Take 1 tablet (500 mg total) by mouth 2 (two) times daily as needed for moderate pain. 30 tablet Coral Spikes, DO      PDMP not reviewed this encounter.   Coral Spikes, DO 07/17/22 1018

## 2022-08-07 ENCOUNTER — Ambulatory Visit
Admission: EM | Admit: 2022-08-07 | Discharge: 2022-08-07 | Disposition: A | Discharge status: Home / Self Care | Payer: Federal, State, Local not specified - PPO | Attending: Emergency Medicine | Admitting: Emergency Medicine

## 2022-08-07 DIAGNOSIS — J029 Acute pharyngitis, unspecified: Secondary | ICD-10-CM

## 2022-08-07 DIAGNOSIS — J302 Other seasonal allergic rhinitis: Secondary | ICD-10-CM

## 2022-08-07 LAB — POCT MONO SCREEN (KUC): Mono, POC: NEGATIVE

## 2022-08-07 MED ORDER — CETIRIZINE HCL 10 MG PO TABS: 10.0000 mg | ORAL_TABLET | Freq: Every day | ORAL | 0 refills | Status: AC

## 2022-08-07 MED ORDER — FLUTICASONE PROPIONATE 50 MCG/ACT NA SUSP: 1.0000 | Freq: Every day | NASAL | 0 refills | Status: AC

## 2022-08-07 NOTE — Discharge Instructions (Addendum)
The mono test is negative.    Use the Flonase nasal spray and take the Zyrtec as directed.    Follow up with your primary care provider.

## 2022-08-07 NOTE — ED Provider Notes (Signed)
[facility]    CSN: [id number] Arrival date & time: 08/07/22  0906      History   Chief Complaint Chief Complaint  Patient presents with   Sore Throat    HPI Jessica Weaver is a 21 y.o. female.  Accompanied by her mother, patient presents with ongoing sore throat x1 month.  She also reports nausea and mild nonproductive cough.  She has postnasal drip.  She reports 3 negative strep tests since the onset of her symptoms.  She denies fever, chills, shortness of breath, vomiting, diarrhea, or other symptoms.  No OTC medications taken today.   Patient was seen at [facility] on 07/17/2022; diagnosed with viral pharyngitis; strep negative; treated with naproxen.  [facility]; diagnosed with acute pharyngitis; treated with viscous lidocaine.  She was seen at [facility] on 07/18/2022 diagnosed with acute pharyngitis; treated with Flonase nasal spray and prednisone.  The history is provided by the patient, a parent and medical records.    Past Medical History:  Diagnosis Date   Headache     Patient Active Problem List   Diagnosis Date Noted   Episodic tension type headache 06/02/2017   Migraine 09/18/2015    Past Surgical History:  Procedure Laterality Date   BIOPSY BOWEL     [facility]   TONSILLECTOMY AND ADENOIDECTOMY  2015    OB History     Gravida  1   Para      Term      Preterm      AB      Living         SAB      IAB      Ectopic      Multiple      Live Births               Home Medications    Prior to Admission medications   Medication Sig Start Date End Date Taking? Authorizing Provider  cetirizine (ZYRTEC ALLERGY) 10 MG tablet Take 1 tablet (10 mg total) by mouth daily. 08/07/22  Yes Sharion Balloon, NP  fluticasone (FLONASE) 50 MCG/ACT nasal spray Place 1 spray into both nostrils daily. 08/07/22  Yes Sharion Balloon, NP  Biotin 1000 MCG tablet Take by mouth.    [provider]   Multiple Vitamins-Minerals (MULTIVITAMIN GUMMIES WOMENS) CHEW Chew by mouth.    [provider]  naproxen (NAPROSYN) 500 MG tablet Take 1 tablet (500 mg total) by mouth 2 (two) times daily as needed for moderate pain. 07/17/22   Coral Spikes, DO    Family History Family History  Problem Relation Age of Onset   Headache Father     Social History Social History   Tobacco Use   Smoking status: Never   Smokeless tobacco: Never  Vaping Use   Vaping Use: Never used  Substance Use Topics   Alcohol use: Never    Alcohol/week: 0.0 standard drinks of alcohol   Drug use: Never     Allergies   Patient has no known allergies.   Review of Systems Review of Systems  Constitutional:  Negative for chills and fever.  HENT:  Positive for postnasal drip and sore throat. Negative for ear pain.   Respiratory:  Positive for cough. Negative for shortness of breath.   Cardiovascular:  Negative for chest pain and palpitations.  Gastrointestinal:  Positive for nausea. Negative for abdominal pain, diarrhea and vomiting.  Skin:  Negative for color change and rash.  All other systems reviewed and are negative.    Physical Exam Triage Vital Signs ED Triage Vitals  Enc Vitals Group     BP      Pulse      Resp      Temp      Temp src      SpO2      Weight      Height      Head Circumference      Peak Flow      Pain Score      Pain Loc      Pain Edu?      Excl. in GC?    No data found.  Updated Vital Signs BP 116/82   Pulse 94   Temp 97.9 F (36.6 C)   Resp 18   LMP 07/19/2022 (Approximate)   SpO2 100%   Visual Acuity Right Eye Distance:   Left Eye Distance:   Bilateral Distance:    Right Eye Near:   Left Eye Near:    Bilateral Near:     Physical Exam Vitals and nursing note reviewed.  Constitutional:      General: She is not in acute distress.    Appearance: Normal appearance. She is well-developed. She is not ill-appearing.  HENT:     Right Ear:  Tympanic membrane normal.     Left Ear: Tympanic membrane normal.     Nose: Nose normal.     Mouth/Throat:     Mouth: Mucous membranes are moist.     Pharynx: Oropharynx is clear.     Comments: Clear PND. Cardiovascular:     Rate and Rhythm: Normal rate and regular rhythm.     Heart sounds: Normal heart sounds.  Pulmonary:     Effort: Pulmonary effort is normal. No respiratory distress.     Breath sounds: Normal breath sounds.  Abdominal:     Palpations: Abdomen is soft.     Tenderness: There is no abdominal tenderness.  Musculoskeletal:     Cervical back: Neck supple.  Skin:    General: Skin is warm and dry.  Neurological:     Mental Status: She is alert.  Psychiatric:        Mood and Affect: Mood normal.        Behavior: Behavior normal.      UC Treatments / Results  Labs (all labs ordered are listed, but only abnormal results are displayed) Labs Reviewed  POCT MONO SCREEN [facility])    EKG   Radiology No results found.  Procedures Procedures (including critical care time)  Medications Ordered in UC Medications - No data to display  Initial Impression / Assessment and Plan / UC Course  I have reviewed the triage vital signs and the nursing notes.  Pertinent labs & imaging results that were available during my care of the patient were reviewed by me and considered in my medical decision making (see chart for details).   Seasonal allergies, sore throat.  Monotest negative.  Patient declines strep test today.  She has had 3 previous negative strep tests, as well as several negative COVID.  Treating with Flonase nasal spray and Zyrtec.  Instructed patient to follow-up with her PCP.  Education provided on sore throat and allergic rhinitis.  She agrees to plan of care.   Final Clinical Impressions(s) / UC Diagnoses   Final diagnoses:  Seasonal allergies  Sore throat     Discharge Instructions  The mono test is negative.    Use the Flonase nasal spray  and take the Zyrtec as directed.    Follow up with your primary care provider.        ED Prescriptions     Medication Sig Dispense Auth. Provider   cetirizine (ZYRTEC ALLERGY) 10 MG tablet Take 1 tablet (10 mg total) by mouth daily. 30 tablet Mickie Bail, NP   fluticasone (FLONASE) 50 MCG/ACT nasal spray Place 1 spray into both nostrils daily. 16 g Mickie Bail, NP      PDMP not reviewed this encounter.   Mickie Bail, NP 08/07/22 1041

## 2022-08-07 NOTE — ED Triage Notes (Signed)
Patient presents to UC for sore throat x 1 month. States she has been seen for several times for post nasal drip and sore throat, treated with naproxen, prednisone with no improvement. 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   Denies fever.

## 2023-06-14 ENCOUNTER — Ambulatory Visit
Admission: EM | Admit: 2023-06-14 | Discharge: 2023-06-14 | Disposition: A | Discharge status: Home / Self Care | Payer: Federal, State, Local not specified - PPO

## 2023-06-14 DIAGNOSIS — J0100 Acute maxillary sinusitis, unspecified: Secondary | ICD-10-CM | POA: Diagnosis not present

## 2023-06-14 MED ORDER — AMOXICILLIN 875 MG PO TABS: 875.0000 mg | ORAL_TABLET | Freq: Two times a day (BID) | ORAL | 0 refills | Status: AC

## 2023-06-14 NOTE — ED Triage Notes (Addendum)
Patient to Urgent Care with complaints of sore throat, cough, right sided ear pain that started over one week ago. Started feeling worse on Sunday. Denies any known fevers.  Increased cough today, wheezing when she was lying down. One dose of tylenol. Drinking honey/ using cough drops.

## 2023-06-14 NOTE — Discharge Instructions (Addendum)
Take the amoxicillin as directed.  Follow up with your primary care provider if your symptoms are not improving.   ° ° °

## 2023-06-14 NOTE — ED Provider Notes (Signed)
Renaldo Fiddler    CSN: [id number] Arrival date & time: 06/14/23  1946      History   Chief Complaint Chief Complaint  Patient presents with   Cough   Sore Throat   Otalgia    HPI Jessica Weaver is a 22 y.o. female.  Patient presents with 10-day history of ear pain, sore throat, congestion, cough.  Her symptoms have been worse for the last 3 days.  She has been treating her symptoms with cough drops and honey.  She took Tylenol once a couple of days ago.  She denies fever, shortness of breath, or other symptoms.  The history is provided by the patient and medical records.    Past Medical History:  Diagnosis Date   Headache     Patient Active Problem List   Diagnosis Date Noted   Episodic tension type headache 06/02/2017   Migraine 09/18/2015    Past Surgical History:  Procedure Laterality Date   BIOPSY BOWEL     [facility]   TONSILLECTOMY AND ADENOIDECTOMY  2015    OB History     Gravida  1   Para      Term      Preterm      AB      Living         SAB      IAB      Ectopic      Multiple      Live Births               Home Medications    Prior to Admission medications   Medication Sig Start Date End Date Taking? Authorizing Provider  amoxicillin (AMOXIL) 875 MG tablet Take 1 tablet (875 mg total) by mouth 2 (two) times daily for 7 days. 06/14/23 06/21/23 Yes Mickie Bail, NP  Biotin 1000 MCG tablet Take by mouth.    [provider]  cetirizine (ZYRTEC ALLERGY) 10 MG tablet Take 1 tablet (10 mg total) by mouth daily. 08/07/22   Mickie Bail, NP  escitalopram (LEXAPRO) 10 MG tablet Take 10 mg by mouth daily.    [provider]  fluticasone (FLONASE) 50 MCG/ACT nasal spray Place 1 spray into both nostrils daily. 08/07/22   Mickie Bail, NP  JUNEL 1/20 1-20 MG-MCG tablet Take 1 tablet by mouth daily.    [provider]  Multiple Vitamins-Minerals (MULTIVITAMIN GUMMIES WOMENS) CHEW Chew by mouth.     [provider]  naproxen (NAPROSYN) 500 MG tablet Take 1 tablet (500 mg total) by mouth 2 (two) times daily as needed for moderate pain. 07/17/22   Tommie Sams, DO    Family History Family History  Problem Relation Age of Onset   Headache Father     Social History Social History   Tobacco Use   Smoking status: Never   Smokeless tobacco: Never  Vaping Use   Vaping status: Never Used  Substance Use Topics   Alcohol use: Never    Alcohol/week: 0.0 standard drinks of alcohol   Drug use: Never     Allergies   Patient has no known allergies.   Review of Systems Review of Systems  Constitutional:  Negative for chills and fever.  HENT:  Positive for congestion, ear pain, postnasal drip, rhinorrhea and sore throat.   Respiratory:  Positive for cough. Negative for shortness of breath.   Cardiovascular:  Negative for chest pain and palpitations.  Gastrointestinal:  Negative for diarrhea and  vomiting.  Skin:  Negative for color change and rash.  All other systems reviewed and are negative.    Physical Exam Triage Vital Signs ED Triage Vitals  Encounter Vitals Group     BP      Systolic BP Percentile      Diastolic BP Percentile      Pulse      Resp      Temp      Temp src      SpO2      Weight      Height      Head Circumference      Peak Flow      Pain Score      Pain Loc      Pain Education      Exclude from Growth Chart    No data found.  Updated Vital Signs BP 115/82   Pulse (!) 113   Temp 97.9 F (36.6 C)   Resp 18   LMP 05/17/2023   SpO2 100%   Visual Acuity Right Eye Distance:   Left Eye Distance:   Bilateral Distance:    Right Eye Near:   Left Eye Near:    Bilateral Near:     Physical Exam Vitals and nursing note reviewed.  Constitutional:      General: She is not in acute distress.    Appearance: She is well-developed.  HENT:     Right Ear: Tympanic membrane normal.     Left Ear: Tympanic membrane normal.     Nose:  Congestion and rhinorrhea present.     Mouth/Throat:     Mouth: Mucous membranes are moist.     Pharynx: Oropharynx is clear.  Cardiovascular:     Rate and Rhythm: Normal rate and regular rhythm.     Heart sounds: Normal heart sounds.  Pulmonary:     Effort: Pulmonary effort is normal. No respiratory distress.     Breath sounds: Normal breath sounds.  Musculoskeletal:     Cervical back: Neck supple.  Skin:    General: Skin is warm and dry.  Neurological:     Mental Status: She is alert.      UC Treatments / Results  Labs (all labs ordered are listed, but only abnormal results are displayed) Labs Reviewed - No data to display  EKG   Radiology No results found.  Procedures Procedures (including critical care time)  Medications Ordered in UC Medications - No data to display  Initial Impression / Assessment and Plan / UC Course  I have reviewed the triage vital signs and the nursing notes.  Pertinent labs & imaging results that were available during my care of the patient were reviewed by me and considered in my medical decision making (see chart for details).    Acute sinusitis.  Patient has been symptomatic for 10 days.  Treating with amoxicillin.  Tylenol or ibuprofen as needed for fever or discomfort.  Instructed patient to follow up with her PCP if her symptoms are not improving.  She agrees to plan of care.   Final Clinical Impressions(s) / UC Diagnoses   Final diagnoses:  Acute non-recurrent maxillary sinusitis     Discharge Instructions      Take the amoxicillin as directed.  Follow up with your primary care provider if your symptoms are not improving.        ED Prescriptions     Medication Sig Dispense Auth. Provider   amoxicillin (AMOXIL) 875 MG tablet Take  1 tablet (875 mg total) by mouth 2 (two) times daily for 7 days. 14 tablet Mickie Bail, NP      PDMP not reviewed this encounter.   Mickie Bail, NP 06/14/23 [phone]

## 2023-07-24 ENCOUNTER — Other Ambulatory Visit: Payer: Self-pay

## 2023-07-24 ENCOUNTER — Emergency Department
Admission: EM | Admit: 2023-07-24 | Discharge: 2023-07-24 | Disposition: A | Discharge status: Home / Self Care | Payer: Federal, State, Local not specified - PPO | Attending: Emergency Medicine | Admitting: Emergency Medicine

## 2023-07-24 DIAGNOSIS — O210 Mild hyperemesis gravidarum: Secondary | ICD-10-CM

## 2023-07-24 DIAGNOSIS — Z3A01 Less than 8 weeks gestation of pregnancy: Secondary | ICD-10-CM | POA: Diagnosis not present

## 2023-07-24 DIAGNOSIS — O219 Vomiting of pregnancy, unspecified: Secondary | ICD-10-CM | POA: Insufficient documentation

## 2023-07-24 LAB — CBC
HCT: 38.3 % (ref 36.0–46.0)
Hemoglobin: 12.8 g/dL (ref 12.0–15.0)
MCH: 29.8 pg (ref 26.0–34.0)
MCHC: 33.4 g/dL (ref 30.0–36.0)
MCV: 89.1 fL (ref 80.0–100.0)
Platelets: 272 10*3/uL (ref 150–400)
RBC: 4.3 MIL/uL (ref 3.87–5.11)
RDW: 12 % (ref 11.5–15.5)
WBC: 9.9 10*3/uL (ref 4.0–10.5)
nRBC: 0 % (ref 0.0–0.2)

## 2023-07-24 LAB — COMPREHENSIVE METABOLIC PANEL
ALT: 15 U/L (ref 0–44)
AST: 22 U/L (ref 15–41)
Albumin: 4.1 g/dL (ref 3.5–5.0)
Alkaline Phosphatase: 55 U/L (ref 38–126)
Anion gap: 10 (ref 5–15)
BUN: 15 mg/dL (ref 6–20)
CO2: 21 mmol/L — ABNORMAL LOW (ref 22–32)
Calcium: 9.3 mg/dL (ref 8.9–10.3)
Chloride: 100 mmol/L (ref 98–111)
Creatinine, Ser: 0.45 mg/dL (ref 0.44–1.00)
GFR, Estimated: >60 mL/min (ref >60)
Glucose, Bld: 109 mg/dL — ABNORMAL HIGH (ref 70–99)
Potassium: 3.6 mmol/L (ref 3.5–5.1)
Sodium: 131 mmol/L — ABNORMAL LOW (ref 135–145)
Total Bilirubin: 0.6 mg/dL (ref 0.3–1.2)
Total Protein: 7 g/dL (ref 6.5–8.1)

## 2023-07-24 LAB — URINALYSIS, ROUTINE W REFLEX MICROSCOPIC
Bilirubin Urine: NEGATIVE
Glucose, UA: NEGATIVE mg/dL
Hgb urine dipstick: NEGATIVE
Ketones, ur: NEGATIVE mg/dL
Nitrite: NEGATIVE
Protein, ur: NEGATIVE mg/dL
Specific Gravity, Urine: 1.021 (ref 1.005–1.030)
pH: 5 (ref 5.0–8.0)

## 2023-07-24 LAB — LIPASE, BLOOD: Lipase: 27 U/L (ref 11–51)

## 2023-07-24 LAB — POC URINE PREG, ED: Preg Test, Ur: POSITIVE — AB

## 2023-07-24 MED ORDER — ONDANSETRON HCL 4 MG/2ML IJ SOLN
4.0000 mg | Freq: Once | INTRAMUSCULAR | Status: AC
  Administered 2023-07-24: 4 mg via INTRAVENOUS
  Filled 2023-07-24: qty 2

## 2023-07-24 MED ORDER — ONDANSETRON 4 MG PO TBDP: 4.0000 mg | ORAL_TABLET | Freq: Three times a day (TID) | ORAL | 0 refills | Status: AC | PRN

## 2023-07-24 MED ORDER — SODIUM CHLORIDE 0.9 % IV BOLUS
1000.0000 mL | Freq: Once | INTRAVENOUS | Status: AC
  Administered 2023-07-24: 1000 mL via INTRAVENOUS

## 2023-07-24 MED ORDER — METOCLOPRAMIDE HCL 10 MG PO TABS: 10.0000 mg | ORAL_TABLET | Freq: Three times a day (TID) | ORAL | 0 refills | Status: AC | PRN

## 2023-07-24 NOTE — ED Notes (Signed)
D/C, reasons to return and new RX discussed with pt, pt verbalized understanding. NAD noted. Pt ambulatory with steady gait. Mom with pt at time of D/C. VSS.

## 2023-07-24 NOTE — ED Notes (Signed)
Pt presents to ED with mom with /co of N/V due to pregnancy, pt states she is about 8 weeks. Pt denies any vaginal bleeding or ABD cramping. Pt states HX of miscarriage about 2 years ago. Pt states she is currently being seen by OB/GYN due to past miscarriage. NAD noted. Pt calm and cooperative at this time. Mom at bedside.

## 2023-07-24 NOTE — ED Triage Notes (Signed)
Pt ambulatory to ER with mom. Pt is [redacted] weeks pregnant with extreme vomiting and nausea, worried if she's dehydrated. Urine is dark yellow. OB recommended coming to ER.

## 2023-07-24 NOTE — ED Provider Notes (Signed)
[facility] Provider Note    Event Date/Time   First MD Initiated Contact with Patient 07/24/23 1501     (approximate)   History   Emesis   HPI  Jessica Weaver is a 22 y.o. female G1P0 at approximately 7 weeks by ultrasound with a history of migraines who presents with nausea and vomiting over the last few weeks, persistent course.  The patient denies any associated abdominal pain.  She has no vaginal bleeding.  She denies diarrhea.  There is no blood in the vomitus.  She was prescribed Unisom and B6 by her OB/GYN last week, but these have not helped.  I reviewed the past medical records.  The patient's most recent outpatient encounter was with OB/GYN on 9/10 for pregnancy of unknown location.  At that time ultrasound showed an IUP with no cardiac activity yet seen.   Physical Exam   Triage Vital Signs: ED Triage Vitals  Encounter Vitals Group     BP 07/24/23 1330 106/84     Systolic BP Percentile --      Diastolic BP Percentile --      Pulse Rate 07/24/23 1330 81     Resp 07/24/23 1330 20     Temp 07/24/23 1330 98.4 F (36.9 C)     Temp Source 07/24/23 1330 Oral     SpO2 07/24/23 1330 100 %     Weight 07/24/23 1325 94 lb (42.6 kg)     Height 07/24/23 1325 4\' 11"  (1.499 m)     Head Circumference --      Peak Flow --      Pain Score 07/24/23 1324 0     Pain Loc --      Pain Education --      Exclude from Growth Chart --     Most recent vital signs: Vitals:   07/24/23 1330  BP: 106/84  Pulse: 81  Resp: 20  Temp: 98.4 F (36.9 C)  SpO2: 100%     General: Awake, no distress.  CV:  Good peripheral perfusion.  Resp:  Normal effort.  Abd:  Soft and nontender.  No distention.  Other:  No jaundice or scleral icterus.  Slightly dry mucous membranes.   ED Results / Procedures / Treatments   Labs (all labs ordered are listed, but only abnormal results are displayed) Labs Reviewed  COMPREHENSIVE METABOLIC PANEL - Abnormal; Notable  for the following components:      Result Value   Sodium 131 (*)    CO2 21 (*)    Glucose, Bld 109 (*)    All other components within normal limits  URINALYSIS, ROUTINE W REFLEX MICROSCOPIC - Abnormal; Notable for the following components:   Color, Urine YELLOW (*)    APPearance HAZY (*)    Leukocytes,Ua SMALL (*)    Bacteria, UA RARE (*)    All other components within normal limits  POC URINE PREG, ED - Abnormal; Notable for the following components:   Preg Test, Ur POSITIVE (*)    All other components within normal limits  LIPASE, BLOOD  CBC     EKG    RADIOLOGY    PROCEDURES:  Critical Care performed: No  Procedures   MEDICATIONS ORDERED IN ED: Medications  sodium chloride 0.9 % bolus 1,000 mL (1,000 mLs Intravenous Bolus 07/24/23 1540)  ondansetron (ZOFRAN) injection 4 mg (4 mg Intravenous Given 07/24/23 1542)     IMPRESSION / MDM / ASSESSMENT AND PLAN / ED COURSE  I reviewed the triage vital signs and the nursing notes.  22 year old female G2P0 at 7 weeks presents with nausea and vomiting over the last few weeks, not relieved by Unisom or B6 at home.  She has no abdominal pain or other concerning acute symptoms.  Differential diagnosis includes, but is not limited to, hyperemesis gravidarum, gastritis, gastroparesis.  Patient's presentation is most consistent with acute complicated illness / injury requiring diagnostic workup.  Lab workup is reassuring.  Electrolytes are normal except for borderline low sodium.  LFTs and lipase are normal.  UA does not show any ketones.  We will give a liter of fluid, IV Zofran, and reassess.  ----------------------------------------- 4:50 PM on 07/24/2023 -----------------------------------------  Patient is feeling better.  She is comfortable going home.  She is stable for discharge at this time.  I have prescribed both Reglan and Zofran and counseled her on their use.  She will follow-up with her OB/GYN.  I gave strict  return precautions and she expressed understanding.   FINAL CLINICAL IMPRESSION(S) / ED DIAGNOSES   Final diagnoses:  Hyperemesis gravidarum     Rx / DC Orders   ED Discharge Orders          Ordered    ondansetron (ZOFRAN-ODT) 4 MG disintegrating tablet  Every 8 hours PRN        07/24/23 1632    metoCLOPramide (REGLAN) 10 MG tablet  Every 8 hours PRN        07/24/23 1632             Note:  This document was prepared using Dragon voice recognition software and may include unintentional dictation errors.    Dionne Bucy, MD 07/24/23 1650

## 2023-07-24 NOTE — Discharge Instructions (Signed)
Start taking the Reglan (metoclopramide).  If this is not helping with the nausea you may switch to the Zofran (ondansetron).  Follow-up with your OB/GYN.  Return for new, worsening, or persistent severe nausea or vomiting, abdominal pain, bleeding, weakness or lightheadedness, or any other new or worsening symptoms that concern you.

## 2024-12-05 ENCOUNTER — Emergency Department: Admission: EM | Admit: 2024-12-05 | Discharge: 2024-12-05 | Disposition: A | Discharge status: Home / Self Care

## 2024-12-05 ENCOUNTER — Other Ambulatory Visit: Payer: Self-pay

## 2024-12-05 DIAGNOSIS — F419 Anxiety disorder, unspecified: Secondary | ICD-10-CM | POA: Insufficient documentation

## 2024-12-05 DIAGNOSIS — R55 Syncope and collapse: Secondary | ICD-10-CM | POA: Insufficient documentation

## 2024-12-05 DIAGNOSIS — R002 Palpitations: Secondary | ICD-10-CM

## 2024-12-05 LAB — URINALYSIS, ROUTINE W REFLEX MICROSCOPIC
Bilirubin Urine: NEGATIVE
Glucose, UA: NEGATIVE mg/dL
Ketones, ur: NEGATIVE mg/dL
Leukocytes,Ua: NEGATIVE
Nitrite: NEGATIVE
Protein, ur: NEGATIVE mg/dL
RBC / HPF: 0 RBC/hpf (ref 0–5)
Specific Gravity, Urine: 1.008 (ref 1.005–1.030)
pH: 7 (ref 5.0–8.0)

## 2024-12-05 LAB — CBC
HCT: 43.9 % (ref 36.0–46.0)
Hemoglobin: 15.2 g/dL — ABNORMAL HIGH (ref 12.0–15.0)
MCH: 30 pg (ref 26.0–34.0)
MCHC: 34.6 g/dL (ref 30.0–36.0)
MCV: 86.8 fL (ref 80.0–100.0)
Platelets: 357 10*3/uL (ref 150–400)
RBC: 5.06 MIL/uL (ref 3.87–5.11)
RDW: 11.5 % (ref 11.5–15.5)
WBC: 13.4 10*3/uL — ABNORMAL HIGH (ref 4.0–10.5)
nRBC: 0 % (ref 0.0–0.2)

## 2024-12-05 LAB — MAGNESIUM: Magnesium: 2 mg/dL (ref 1.7–2.4)

## 2024-12-05 LAB — COMPREHENSIVE METABOLIC PANEL WITH GFR
ALT: 38 U/L (ref 0–44)
AST: 25 U/L (ref 15–41)
Albumin: 5.1 g/dL — ABNORMAL HIGH (ref 3.5–5.0)
Alkaline Phosphatase: 123 U/L (ref 38–126)
Anion gap: 16 — ABNORMAL HIGH (ref 5–15)
BUN: 11 mg/dL (ref 6–20)
CO2: 22 mmol/L (ref 22–32)
Calcium: 9.8 mg/dL (ref 8.9–10.3)
Chloride: 103 mmol/L (ref 98–111)
Creatinine, Ser: 0.61 mg/dL (ref 0.44–1.00)
GFR, Estimated: >60 mL/min
Glucose, Bld: 97 mg/dL (ref 70–99)
Potassium: 4.3 mmol/L (ref 3.5–5.1)
Sodium: 141 mmol/L (ref 135–145)
Total Bilirubin: 0.6 mg/dL (ref 0.0–1.2)
Total Protein: 7.6 g/dL (ref 6.5–8.1)

## 2024-12-05 LAB — POC URINE PREG, ED: Preg Test, Ur: NEGATIVE

## 2024-12-05 LAB — TROPONIN T, HIGH SENSITIVITY: Troponin T High Sensitivity: <6 ng/L (ref 0–19)

## 2024-12-05 MED ORDER — METHOCARBAMOL 500 MG PO TABS
500.0000 mg | ORAL_TABLET | Freq: Once | ORAL | Status: AC
  Administered 2024-12-05: 500 mg via ORAL
  Filled 2024-12-05: qty 1

## 2024-12-05 NOTE — ED Triage Notes (Signed)
 Arrives c/o heart rate and BP have been going up since Saturday and feeling like she is going to pass out.  States HR was 140.   Seen at an ED in [place]  on Saturday for same  AAOx3. Skin warm and dry. NAD

## 2024-12-05 NOTE — Discharge Instructions (Signed)
 Please follow-up with your primary care physician within the next few days.  Return to the emergency department if you have any new or worsening symptoms.

## 2024-12-05 NOTE — ED Provider Notes (Signed)
 "  [facility] Provider Note    Event Date/Time   First MD Initiated Contact with Patient 12/05/24 1122     (approximate)   History   Near Syncope   HPI  Jessica Weaver is a 24 y.o. female presenting to the emergency department complaining of dizziness and feeling as if her heart was racing which has been ongoing for the last 5 days.  Patient reports that she was seen 5 days ago at an emergency department in [place]  for the same complaint but the symptoms have continued despite her negative workup there.  She states she was told it was anxiety.  She states she feels as if she is going to pass out.     Physical Exam   Triage Vital Signs: ED Triage Vitals  Encounter Vitals Group     BP 12/05/24 1047 139/86     Girls Systolic BP Percentile --      Girls Diastolic BP Percentile --      Boys Systolic BP Percentile --      Boys Diastolic BP Percentile --      Pulse Rate 12/05/24 1047 (!) 115     Resp 12/05/24 1047 16     Temp 12/05/24 1051 98.2 F (36.8 C)     Temp Source 12/05/24 1047 Oral     SpO2 12/05/24 1047 100 %     Weight 12/05/24 1050 115 lb (52.2 kg)     Height 12/05/24 1050 4' 11 (1.499 m)     Head Circumference --      Peak Flow --      Pain Score 12/05/24 1050 3     Pain Loc --      Pain Education --      Exclude from Growth Chart --     Most recent vital signs: Vitals:   12/05/24 1047 12/05/24 1051  BP: 139/86   Pulse: (!) 115   Resp: 16   Temp:  98.2 F (36.8 C)  SpO2: 100%      General: Awake, no distress.  CV:  Good peripheral perfusion.  Resp:  Normal effort.  Abd:  No distention.  Other:     ED Results / Procedures / Treatments   Labs (all labs ordered are listed, but only abnormal results are displayed) Labs Reviewed  URINALYSIS, ROUTINE W REFLEX MICROSCOPIC - Abnormal; Notable for the following components:      Result Value   Color, Urine STRAW (*)    APPearance CLEAR (*)    Hgb urine dipstick  SMALL (*)    Bacteria, UA RARE (*)    All other components within normal limits  POC URINE PREG, ED - Normal  COMPREHENSIVE METABOLIC PANEL WITH GFR  CBC  CBG MONITORING, ED     EKG  ED ECG REPORT I, Reche CHRISTELLA Leventhal, the attending physician, personally viewed and interpreted this ECG.  Date: 12/05/2024 Rate: 112 bpm Rhythm: Sinus tachycardia QRS Axis: normal Intervals: normal ST/T Wave abnormalities: normal Narrative Interpretation: no evidence of acute ischemia    RADIOLOGY     PROCEDURES:  Critical Care performed: No  Procedures   MEDICATIONS ORDERED IN ED: Medications - No data to display   IMPRESSION / MDM / ASSESSMENT AND PLAN / ED COURSE  I reviewed the triage vital signs and the nursing notes.  Differential diagnosis includes, but is not limited to, ACS, aortic dissection, pulmonary embolism, cardiac tamponade, pneumothorax, pneumonia, pericarditis, myocarditis, GI-related causes including esophagitis/gastritis, and musculoskeletal chest wall pain.     Patient's presentation is most consistent with acute presentation with potential threat to life or bodily function.  Patient is a 24 year old female presenting to the emergency department complaining of dizziness and feeling as if her heart was racing which has been ongoing for the last 5 days.  EKG and lab work ordered for further evaluation.  EKG is unremarkable.  Lab work thus far does not show any cause of the patient's symptoms.  Patient declined D-dimer stating that she had this performed by her PCP a few days ago.  Troponin and magnesium were still pending at time patient was signed out to the oncoming provider.  Patient is aware of plan for likely discharge pending completion of workup with instructions to follow-up with her primary care physician.      FINAL CLINICAL IMPRESSION(S) / ED DIAGNOSES   Final diagnoses:  Near syncope  Palpitations     Rx / DC  Orders   ED Discharge Orders     None        Note:  This document was prepared using Dragon voice recognition software and may include unintentional dictation errors.   Rexford Reche HERO, MD 12/05/24 1630  "
